# Patient Record
Sex: Female | Born: 1970 | Race: White | Hispanic: No | Smoking: Current every day smoker
Health system: Southern US, Community
[De-identification: ages and names within clinical notes are randomized; demographics above are authoritative.]

## PROBLEM LIST (undated history)

## (undated) HISTORY — PX: OOPHORECTOMY: SHX86

## (undated) HISTORY — PX: TUBAL LIGATION: SHX77

---

## 2007-01-31 ENCOUNTER — Emergency Department: Payer: Self-pay | Admitting: Emergency Medicine

## 2007-10-13 ENCOUNTER — Emergency Department: Payer: Self-pay | Admitting: Emergency Medicine

## 2008-01-31 ENCOUNTER — Ambulatory Visit: Payer: Self-pay | Admitting: Advanced Practice Midwife

## 2008-02-09 ENCOUNTER — Ambulatory Visit: Payer: Self-pay | Admitting: Advanced Practice Midwife

## 2008-03-10 ENCOUNTER — Ambulatory Visit: Payer: Self-pay | Admitting: Advanced Practice Midwife

## 2008-04-10 ENCOUNTER — Ambulatory Visit: Payer: Self-pay | Admitting: Advanced Practice Midwife

## 2008-07-04 ENCOUNTER — Inpatient Hospital Stay: Payer: Self-pay

## 2011-07-07 ENCOUNTER — Emergency Department: Payer: Self-pay | Admitting: Emergency Medicine

## 2013-05-14 ENCOUNTER — Emergency Department: Payer: Self-pay | Admitting: Emergency Medicine

## 2014-01-31 IMAGING — CR RIGHT LITTLE FINGER 2+V
1 series · 3 of 3 positions shown · non-contrast
Comparison: none

REASON FOR EXAM: fishhook in finger
COMMENTS:

PROCEDURE:     DXR - DXR FINGER PINKY 5TH DIGIT RT HA  - May 14, 2013  [DATE]
RESULT:     Comparison: None.

[Series 1: pa · 0.17mm/px · 3 of 3 slices shown]
[im 1/3]
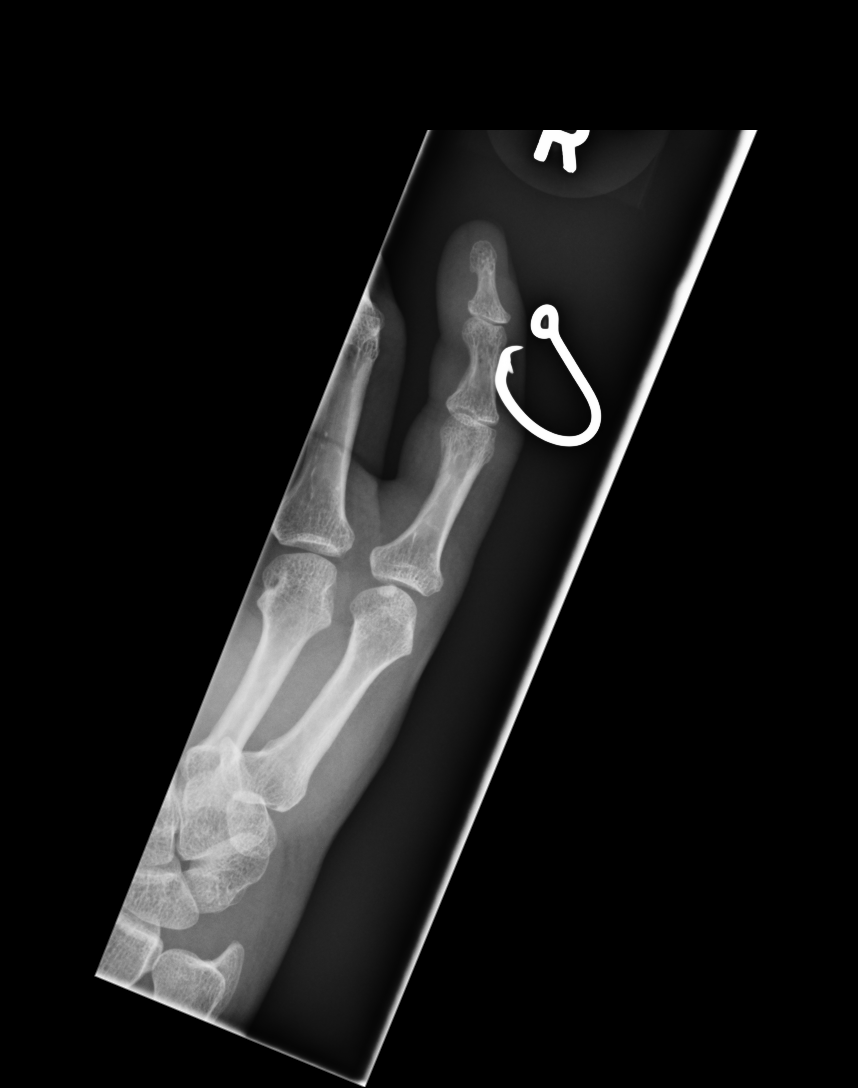
[im 2/3]
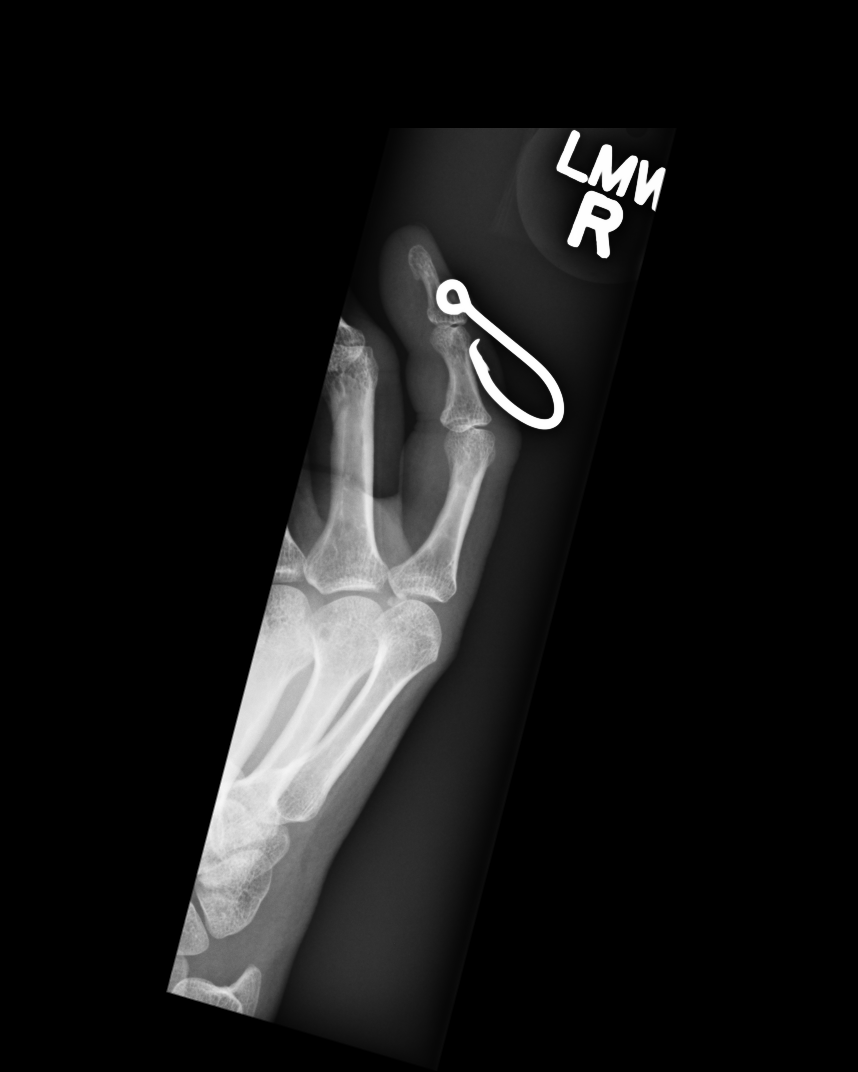
[im 3/3]
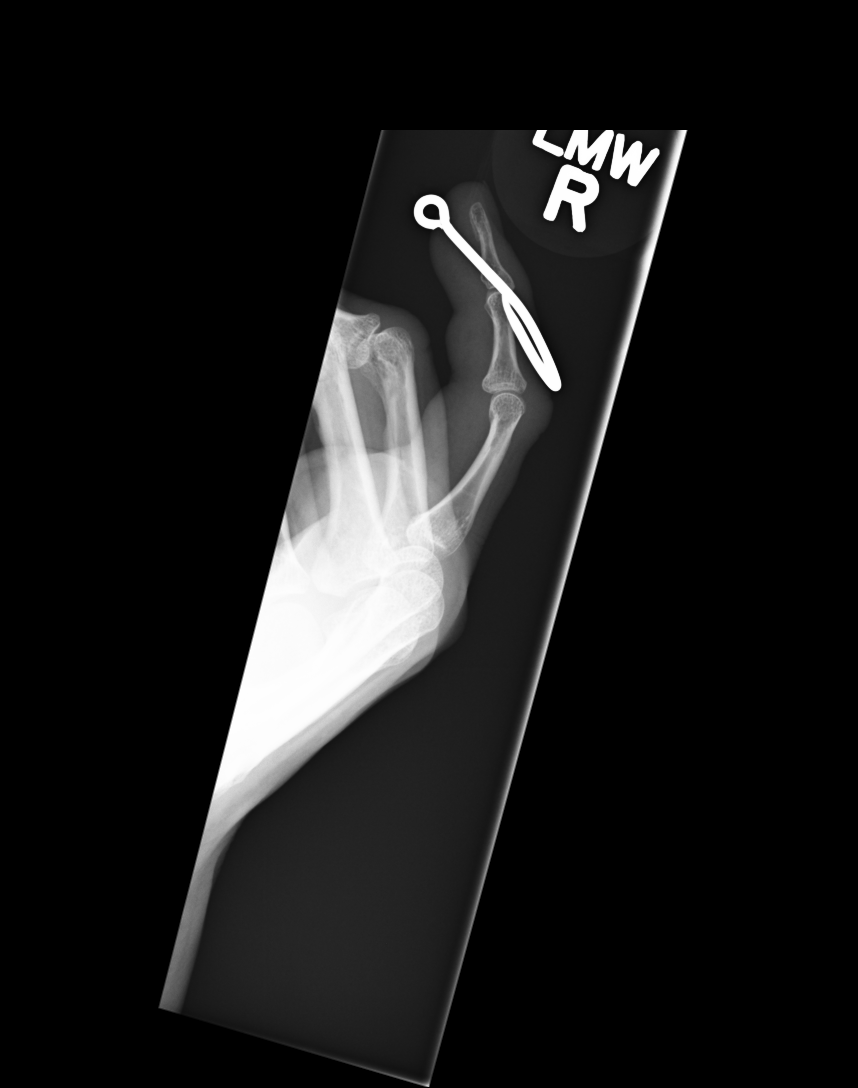

[3 of 3 positions shown; findings below may reference images not displayed]

FINDINGS: There is a metallic hook in the fifth finger. No acute fracture. Normal
alignment.
IMPRESSION: Please see above.

## 2014-09-28 ENCOUNTER — Emergency Department: Payer: Self-pay | Admitting: Emergency Medicine

## 2015-06-17 IMAGING — CR DG CLAVICLE*L*
1 series · 2 of 2 positions shown · non-contrast
Comparison: Left shoulder radiographs earlier today

CLINICAL DATA: Left shoulder pain. Possible clavicle fracture on
recent shoulder radiographs

EXAM:
LEFT CLAVICLE - 2+ VIEWS

[Series 1: dxr clavicle left · 0.14mm/px · 2 of 2 slices shown]
[im 1/2]
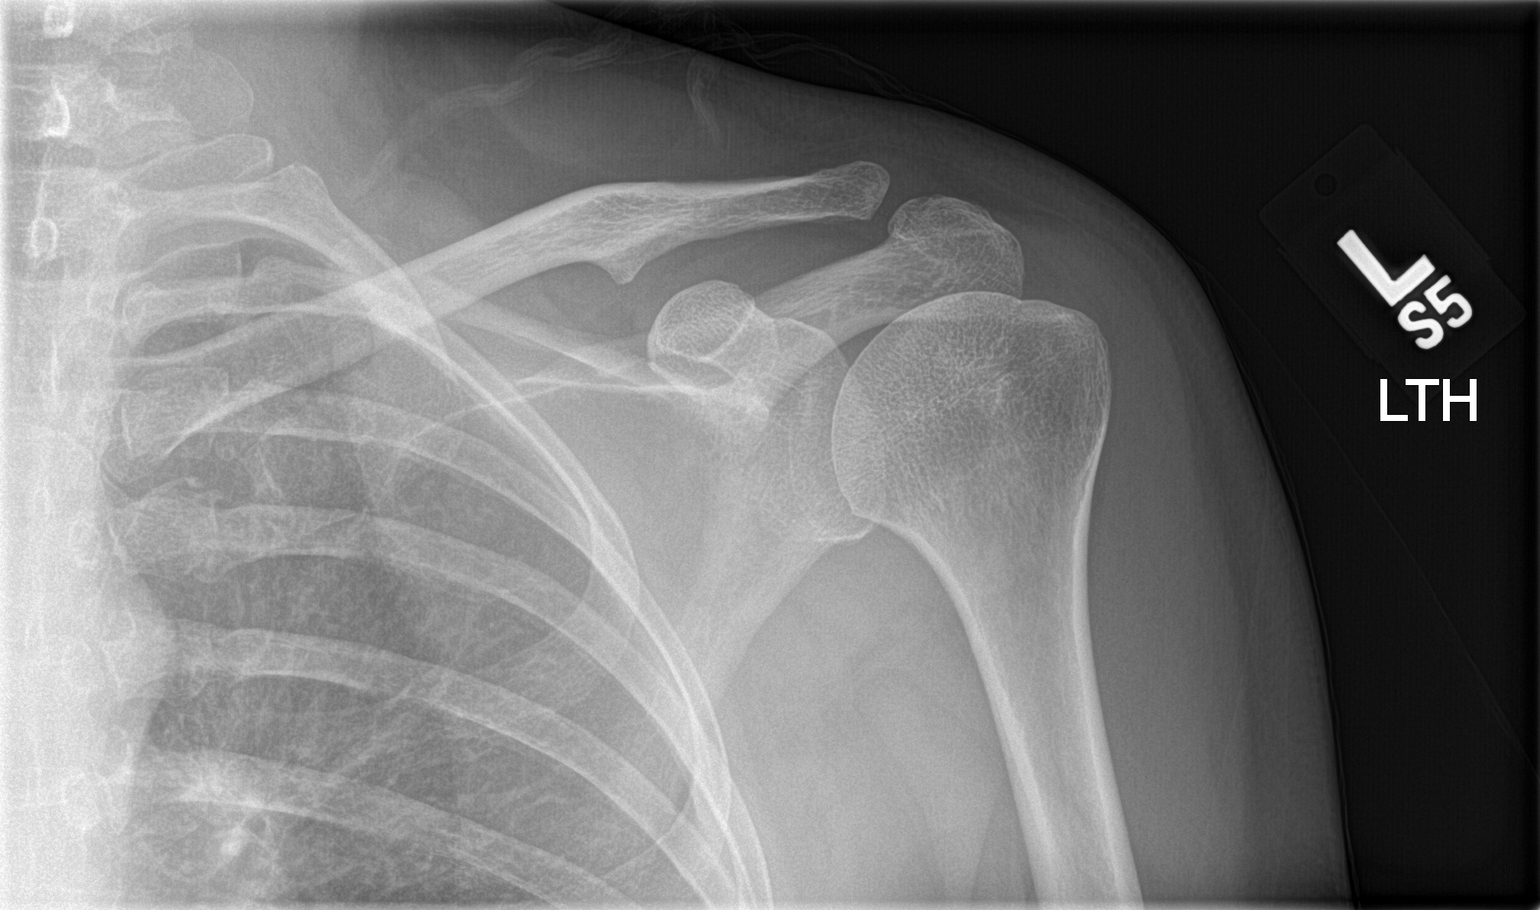
[im 2/2]
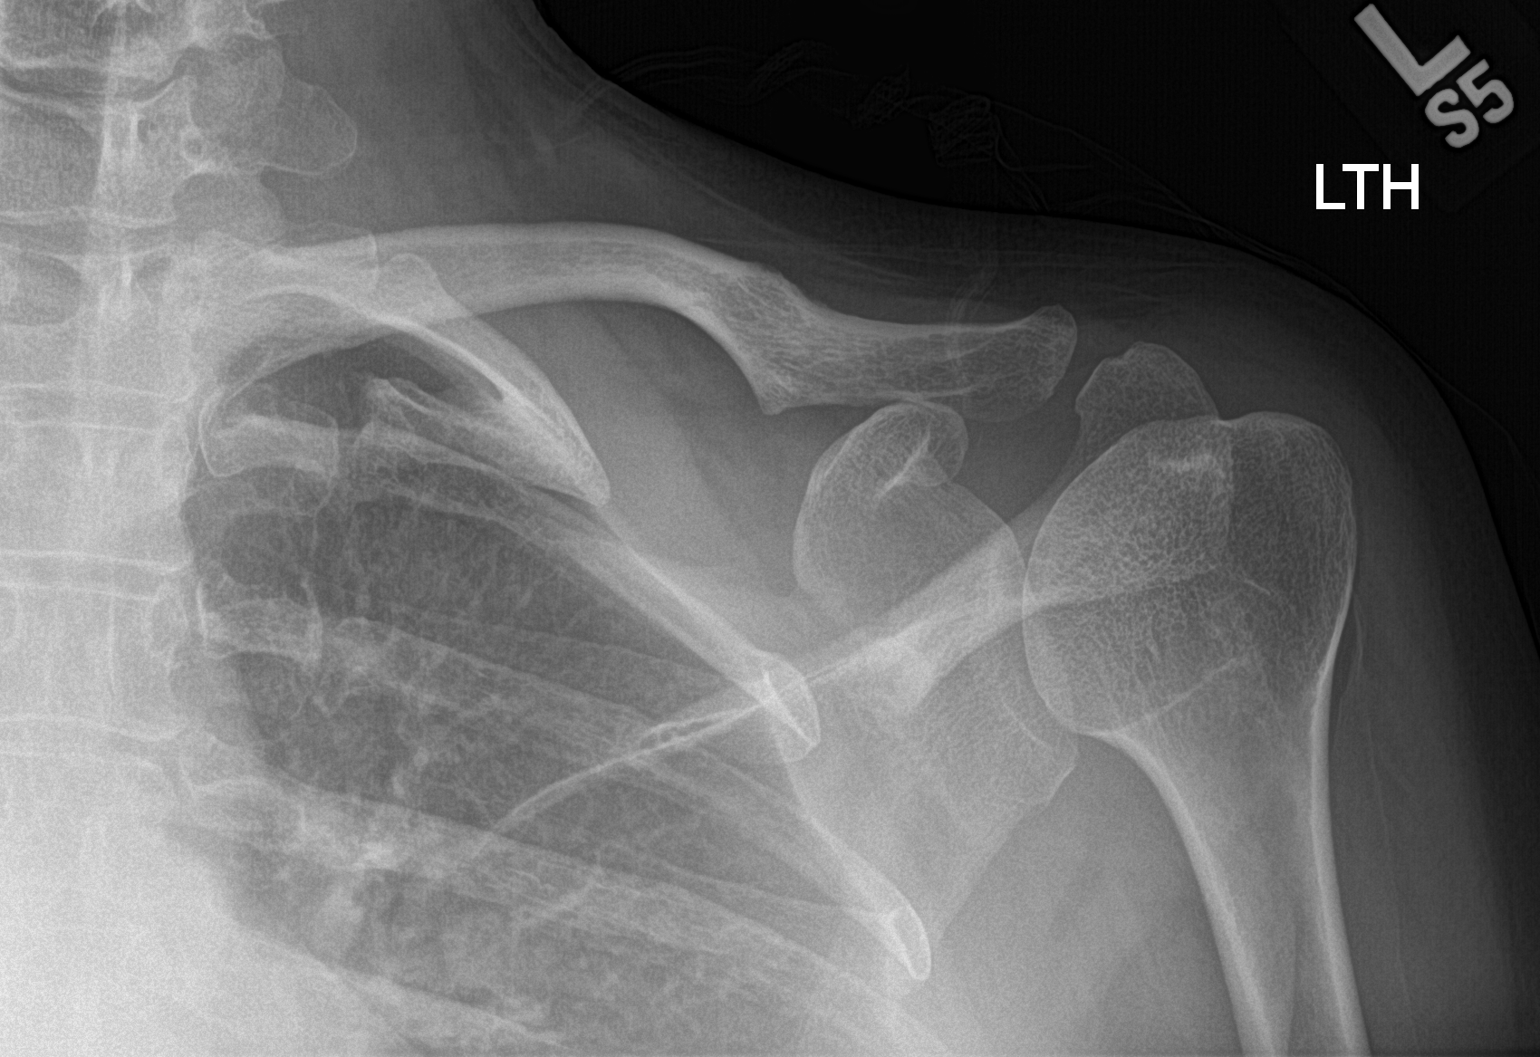

[2 of 2 positions shown; findings below may reference images not displayed]

FINDINGS: There is no evidence of fracture or other focal bone lesions. Soft
tissues are unremarkable.
IMPRESSION: Negative.

## 2017-01-27 ENCOUNTER — Ambulatory Visit (INDEPENDENT_AMBULATORY_CARE_PROVIDER_SITE_OTHER): Payer: BLUE CROSS/BLUE SHIELD | Admitting: Obstetrics and Gynecology

## 2017-01-27 ENCOUNTER — Encounter: Payer: Self-pay | Admitting: Obstetrics and Gynecology

## 2017-01-27 VITALS — BP 133/81 | HR 103 | Ht 61.0 in | Wt 144.2 lb

## 2017-01-27 DIAGNOSIS — Z0000 Encounter for general adult medical examination without abnormal findings: Secondary | ICD-10-CM | POA: Diagnosis not present

## 2017-01-27 DIAGNOSIS — Z87410 Personal history of cervical dysplasia: Secondary | ICD-10-CM | POA: Diagnosis not present

## 2017-01-27 NOTE — Progress Notes (Addendum)
HPI:      [name] is a 46 y.o. [phone] who LMP was Patient's last menstrual period was 01/06/2017 (approximate).  Subjective:   She presents today for her annual examination.  She has not had a preventative medical visit in 9 years. She is not using birth control and is having regular menstrual periods. She is a very poor historian. She is not sure what she is doing for birth control but she believes that "everything was removed at the time of her cesarean delivery"and she has not become pregnant in 9 years of unprotected intercourse. She states that at the time of her cesarean delivery one of her ovaries was removed for many cysts. She when questioned states that she had severe cervical dysplasia and some type of treatment but she does not know what. She has never had a mammogram. She does almost nothing about her family medical history.    Hx: The following portions of the patient's history were reviewed and updated as appropriate:              She  has no past medical history on file. She  does not have a problem list on file. She  has a past surgical history that includes Cesarean section; Oophorectomy; and Tubal ligation. Her family history includes Diabetes in her sister. She  reports that she has been smoking.  She has never used smokeless tobacco. She reports that she drinks alcohol. She reports that she does not use drugs. No current outpatient prescriptions on file prior to visit.   No current facility-administered medications on file prior to visit.          Review of Systems:  Review of Systems  Constitutional: Denied constitutional symptoms, night sweats, recent illness, fatigue, fever, insomnia and weight loss.  Eyes: Denied eye symptoms, eye pain, photophobia, vision change and visual disturbance.  Ears/Nose/Throat/Neck: Denied ear, nose, throat or neck symptoms, hearing loss, nasal discharge, sinus congestion and sore throat.  Cardiovascular: Denied  cardiovascular symptoms, arrhythmia, chest pain/pressure, edema, exercise intolerance, orthopnea and palpitations.  Respiratory: Denied pulmonary symptoms, asthma, pleuritic pain, productive sputum, cough, dyspnea and wheezing.  Gastrointestinal: Denied, gastro-esophageal reflux, melena, nausea and vomiting.  Genitourinary: Denied genitourinary symptoms including symptomatic vaginal discharge, pelvic relaxation issues, and urinary complaints.  Musculoskeletal: Denied musculoskeletal symptoms, stiffness, swelling, muscle weakness and myalgia.  Dermatologic: Denied dermatology symptoms, rash and scar.  Neurologic: Denied neurology symptoms, dizziness, headache, neck pain and syncope.  Psychiatric: Denied psychiatric symptoms, anxiety and depression.  Endocrine: Denied endocrine symptoms including hot flashes and night sweats.   Meds:   No current outpatient prescriptions on file prior to visit.   No current facility-administered medications on file prior to visit.     Objective:     Vitals:   01/27/17 1456  BP: 133/81  Pulse: (!) 103              Physical examination General NAD, Conversant  HEENT Atraumatic; Op clear with mmm.  Normo-cephalic. Pupils reactive. Anicteric sclerae  Thyroid/Neck Smooth without nodularity or enlargement. Normal ROM.  Neck Supple.  Skin No rashes, lesions or ulceration. Normal palpated skin turgor. No nodularity.  Breasts: No masses or discharge.  Symmetric.  No axillary adenopathy.  Lungs: Clear to auscultation.No rales or wheezes. Normal Respiratory effort, no retractions.  Heart: NSR.  No murmurs or rubs appreciated. No periferal edema  Abdomen: Soft.  Non-tender.  No masses.  No HSM. No hernia  Extremities: Moves all appropriately.  Normal ROM for age. No lymphadenopathy.  Neuro: Oriented to PPT.  Normal mood. Normal affect.     Pelvic:   Vulva: Normal appearance.  No lesions.  Vagina: No lesions or abnormalities noted.  Support: Normal pelvic  support.  Urethra No masses tenderness or scarring.  Meatus Normal size without lesions or prolapse.  Cervix: Normal appearance.  No lesions. Obviously status post postprocedure   Anus: Normal exam.  No lesions.  Perineum: Normal exam.  No lesions.        Bimanual   Uterus: Normal size.  Non-tender.  Mobile.  AV.  Adnexae: No masses.  Non-tender to palpation.  Cul-de-sac: Negative for abnormality.      Assessment:    G3P3003 There are no active problems to display for this patient.    1. Encounter for annual physical exam   2. History of cervical dysplasia        Plan:            1.  Basic Screening Recommendations The basic screening recommendations for asymptomatic women were discussed with the patient during her visit.  The age-appropriate recommendations were discussed with her and the rational for the tests reviewed.  When I am informed by the patient that another primary care physician has previously obtained the age-appropriate tests and they are up-to-date, only outstanding tests are ordered and referrals given as necessary.  Abnormal results of tests will be discussed with her when all of her results are completed. Pap performed-mammogram ordered-blood work ordered. 2.  I have asked her to sign a release form so that we may obtain her surgical records from her last cesarean delivery. I would also like the path report from her oophorectomy. Orders Orders Placed This Encounter  Procedures  . MM DIGITAL SCREENING BILATERAL  . Cholesterol, total  . Glucose, random  . Lipid panel  . TSH       F/U  Return in about 1 year (around 01/27/2018) for Annual Physical.  Elonda Husky, M.D. 01/27/2017 3:34 PM

## 2017-01-28 ENCOUNTER — Other Ambulatory Visit: Payer: BLUE CROSS/BLUE SHIELD

## 2017-01-29 ENCOUNTER — Telehealth: Payer: Self-pay

## 2017-01-29 LAB — PAP IG AND HPV HIGH-RISK
HPV, high-risk: NEGATIVE
PAP Smear Comment: 0

## 2017-01-29 LAB — LIPID PANEL
CHOL/HDL RATIO: 6.2 ratio — AB (ref 0.0–4.4)
Cholesterol, Total: 231 mg/dL — ABNORMAL HIGH (ref 100–199)
HDL: 37 mg/dL — ABNORMAL LOW (ref >39)
TRIGLYCERIDES: 533 mg/dL — AB (ref 0–149)

## 2017-01-29 LAB — TSH: TSH: 0.86 u[IU]/mL (ref 0.450–4.500)

## 2017-01-29 LAB — GLUCOSE, RANDOM: Glucose: 112 mg/dL — ABNORMAL HIGH (ref 65–99)

## 2017-01-29 NOTE — Telephone Encounter (Signed)
Message left on patients voice mail regarding abnormal labs. Provider would like patient to followup with her PCP to manage elevated glucose and cholesterol and triglycerides.

## 2017-01-29 NOTE — Telephone Encounter (Signed)
-----   Message from David James Evans, MD sent at 01/29/2017  2:02 PM EDT ----- Needs referral to Family doctor regarding elevated cholesterol and possible diabetes. 

## 2017-02-02 ENCOUNTER — Telehealth: Payer: Self-pay

## 2017-02-02 NOTE — Telephone Encounter (Signed)
-----   Message from David James Evans, MD sent at 02/02/2017  8:36 AM EDT ----- Negative Pap and HPV 

## 2017-02-02 NOTE — Telephone Encounter (Signed)
-----   Message from Linzie Collinavid James Evans, MD sent at 01/29/2017  2:02 PM EDT ----- [facility]

## 2017-02-02 NOTE — Telephone Encounter (Signed)
Message left for pt to please return my call. 

## 2017-02-02 NOTE — Telephone Encounter (Signed)
See message dated 02/02/17

## 2017-02-02 NOTE — Telephone Encounter (Signed)
Pt informed. States she does not have a PCP but will speak with the nurse at work. States she has an appointment scheduled with a Dr. But can't get in until April.

## 2017-02-02 NOTE — Telephone Encounter (Signed)
-----   Message from Linzie Collinavid James Evans, MD sent at 02/02/2017  8:36 AM EDT ----- Negative Pap and HPV

## 2018-02-02 ENCOUNTER — Encounter: Payer: Self-pay | Admitting: Obstetrics and Gynecology

## 2018-02-02 ENCOUNTER — Ambulatory Visit (INDEPENDENT_AMBULATORY_CARE_PROVIDER_SITE_OTHER): Payer: BLUE CROSS/BLUE SHIELD | Admitting: Obstetrics and Gynecology

## 2018-02-02 VITALS — BP 145/87 | HR 105 | Ht 61.0 in | Wt 136.1 lb

## 2018-02-02 DIAGNOSIS — Z1239 Encounter for other screening for malignant neoplasm of breast: Secondary | ICD-10-CM

## 2018-02-02 DIAGNOSIS — Z01419 Encounter for gynecological examination (general) (routine) without abnormal findings: Secondary | ICD-10-CM | POA: Diagnosis not present

## 2018-02-02 DIAGNOSIS — R7309 Other abnormal glucose: Secondary | ICD-10-CM

## 2018-02-02 DIAGNOSIS — Z1231 Encounter for screening mammogram for malignant neoplasm of breast: Secondary | ICD-10-CM | POA: Diagnosis not present

## 2018-02-02 DIAGNOSIS — Z7689 Persons encountering health services in other specified circumstances: Secondary | ICD-10-CM | POA: Diagnosis not present

## 2018-02-02 DIAGNOSIS — E785 Hyperlipidemia, unspecified: Secondary | ICD-10-CM

## 2018-02-02 NOTE — Progress Notes (Signed)
HPI:      [name] is a 47 y.o. [phone] who LMP was Patient's last menstrual period was 02/01/2018 (exact date).  Subjective:   She presents today for her annual examination.  She has no specific complaints. She did not go to her mammogram as scheduled last year. She was informed regarding her elevated glucose and cholesterol and she chose not to follow-up for this either. She continues to smoke tobacco but because her sister has recently begun to have problems as a result of smoking, [name] "is considering"discontinuation.    Hx: The following portions of the patient's history were reviewed and updated as appropriate:             She  has no past medical history on file. She does not have a problem list on file. She  has a past surgical history that includes Cesarean section; Tubal ligation; and Oophorectomy. Her family history includes Diabetes in her sister. She  reports that she has been smoking cigarettes.  She has been smoking about 1.00 pack per day. She has never used smokeless tobacco. She reports that she drinks alcohol. She reports that she does not use drugs. She has a current medication list which includes the following prescription(s): acetaminophen. She has No Known Allergies.       Review of Systems:  Review of Systems  Constitutional: Denied constitutional symptoms, night sweats, recent illness, fatigue, fever, insomnia and weight loss.  Eyes: Denied eye symptoms, eye pain, photophobia, vision change and visual disturbance.  Ears/Nose/Throat/Neck: Denied ear, nose, throat or neck symptoms, hearing loss, nasal discharge, sinus congestion and sore throat.  Cardiovascular: Denied cardiovascular symptoms, arrhythmia, chest pain/pressure, edema, exercise intolerance, orthopnea and palpitations.  Respiratory: Denied pulmonary symptoms, asthma, pleuritic pain, productive sputum, cough, dyspnea and wheezing.  Gastrointestinal: Denied, gastro-esophageal reflux, melena,  nausea and vomiting.  Genitourinary: Denied genitourinary symptoms including symptomatic vaginal discharge, pelvic relaxation issues, and urinary complaints.  Musculoskeletal: Denied musculoskeletal symptoms, stiffness, swelling, muscle weakness and myalgia.  Dermatologic: Denied dermatology symptoms, rash and scar.  Neurologic: Denied neurology symptoms, dizziness, headache, neck pain and syncope.  Psychiatric: Denied psychiatric symptoms, anxiety and depression.  Endocrine: Denied endocrine symptoms including hot flashes and night sweats.   Meds:   Current Outpatient Medications on File Prior to Visit  Medication Sig Dispense Refill  . acetaminophen (TYLENOL) 500 MG tablet Take 500 mg by mouth every 6 (six) hours as needed.     No current facility-administered medications on file prior to visit.     Objective:     Vitals:   02/02/18 1517  BP: (!) 145/87  Pulse: (!) 105              Physical examination General NAD, Conversant  HEENT Atraumatic; Op clear with mmm.  Normo-cephalic. Pupils reactive. Anicteric sclerae  Thyroid/Neck Smooth without nodularity or enlargement. Normal ROM.  Neck Supple.  Skin No rashes, lesions or ulceration. Normal palpated skin turgor. No nodularity.  Breasts: No masses or discharge.  Symmetric.  No axillary adenopathy.  Lungs: Clear to auscultation.No rales or wheezes. Normal Respiratory effort, no retractions.  Heart: NSR.  No murmurs or rubs appreciated. No periferal edema  Abdomen: Soft.  Non-tender.  No masses.  No HSM. No hernia  Extremities: Moves all appropriately.  Normal ROM for age. No lymphadenopathy.  Neuro: Oriented to PPT.  Normal mood. Normal affect.     Pelvic: Patient has declined pelvic examination because she is having her menstrual period  Assessment:    G3P3003 There are no active problems to display for this patient.    1. Well woman exam with routine gynecological exam   2. Screening for breast cancer   3.  Elevated lipids   4. Elevated glucose   5. Encounter to establish care     Normal Pap and HPV last year.   Plan:            1.  Basic Screening Recommendations The basic screening recommendations for asymptomatic women were discussed with the patient during her visit.  The age-appropriate recommendations were discussed with her and the rational for the tests reviewed.  When I am informed by the patient that another primary care physician has previously obtained the age-appropriate tests and they are up-to-date, only outstanding tests are ordered and referrals given as necessary.  Abnormal results of tests will be discussed with her when all of her results are completed. Cholesterol lipids and fasting glucose ordered Mammogram ordered  2.  Patient strongly encouraged to get her mammogram this year  3.  Patient strongly encouraged to follow-up with a family physician regarding her   cholesterol lipids and glucose.   4.  Patient encouraged to discontinue tobacco use.  orders Orders Placed This Encounter  Procedures  . MM DIGITAL SCREENING BILATERAL  . Glucose, random  . Hemoglobin A1c  . Lipid panel  . TSH  . [facility]    No orders of the defined types were placed in this encounter.       F/U  Return in about 1 year (around 02/03/2019) for Annual Physical.  Elonda Huskyavid J. Evans, M.D. 02/02/2018 4:02 PM

## 2018-02-03 LAB — HEMOGLOBIN A1C
ESTIMATED AVERAGE GLUCOSE: 183 mg/dL
HEMOGLOBIN A1C: 8 % — AB (ref 4.8–5.6)

## 2018-02-03 LAB — LIPID PANEL
CHOL/HDL RATIO: 4.6 ratio — AB (ref 0.0–4.4)
CHOLESTEROL TOTAL: 171 mg/dL (ref 100–199)
HDL: 37 mg/dL — ABNORMAL LOW (ref >39)
LDL Calculated: 78 mg/dL (ref 0–99)
TRIGLYCERIDES: 278 mg/dL — AB (ref 0–149)
VLDL Cholesterol Cal: 56 mg/dL — ABNORMAL HIGH (ref 5–40)

## 2018-02-03 LAB — GLUCOSE, RANDOM: GLUCOSE: 120 mg/dL — AB (ref 65–99)

## 2018-02-03 LAB — TSH: TSH: 1.01 u[IU]/mL (ref 0.450–4.500)

## 2018-06-01 ENCOUNTER — Ambulatory Visit: Payer: BLUE CROSS/BLUE SHIELD | Admitting: Family Medicine

## 2019-02-04 ENCOUNTER — Encounter: Payer: BLUE CROSS/BLUE SHIELD | Admitting: Obstetrics and Gynecology

## 2019-02-08 ENCOUNTER — Encounter: Payer: BLUE CROSS/BLUE SHIELD | Admitting: Obstetrics and Gynecology

## 2019-11-30 ENCOUNTER — Encounter: Payer: BLUE CROSS/BLUE SHIELD | Admitting: Obstetrics and Gynecology

## 2019-12-06 ENCOUNTER — Encounter: Payer: BLUE CROSS/BLUE SHIELD | Admitting: Obstetrics and Gynecology

## 2020-08-22 ENCOUNTER — Ambulatory Visit (INDEPENDENT_AMBULATORY_CARE_PROVIDER_SITE_OTHER): Payer: BC Managed Care – PPO | Admitting: Obstetrics and Gynecology

## 2020-08-22 ENCOUNTER — Other Ambulatory Visit: Payer: Self-pay

## 2020-08-22 ENCOUNTER — Other Ambulatory Visit (HOSPITAL_COMMUNITY)
Admission: RE | Admit: 2020-08-22 | Discharge: 2020-08-22 | Disposition: A | Discharge status: Home / Self Care | Payer: BC Managed Care – PPO | Source: Ambulatory Visit | Attending: Obstetrics and Gynecology | Admitting: Obstetrics and Gynecology

## 2020-08-22 ENCOUNTER — Encounter: Payer: Self-pay | Admitting: Obstetrics and Gynecology

## 2020-08-22 VITALS — BP 168/91 | HR 99 | Ht 61.0 in | Wt 147.6 lb

## 2020-08-22 DIAGNOSIS — Z1231 Encounter for screening mammogram for malignant neoplasm of breast: Secondary | ICD-10-CM

## 2020-08-22 DIAGNOSIS — Z01419 Encounter for gynecological examination (general) (routine) without abnormal findings: Secondary | ICD-10-CM

## 2020-08-22 DIAGNOSIS — E1165 Type 2 diabetes mellitus with hyperglycemia: Secondary | ICD-10-CM

## 2020-08-22 DIAGNOSIS — Z720 Tobacco use: Secondary | ICD-10-CM | POA: Diagnosis not present

## 2020-08-22 DIAGNOSIS — E785 Hyperlipidemia, unspecified: Secondary | ICD-10-CM

## 2020-08-22 DIAGNOSIS — Z124 Encounter for screening for malignant neoplasm of cervix: Secondary | ICD-10-CM | POA: Insufficient documentation

## 2020-08-22 NOTE — Progress Notes (Signed)
HPI:      Ms. WILDER AMODEI is a 49 y.o. [phone] who LMP was Patient's last menstrual period was 07/25/2020 (approximate).  Subjective:   She presents today for her annual examination.  She did not follow-up for her hyperlipidemia or elevated glucose last year.  She has not gone for her mammograms as referred. Patient continues to smoke cigarettes daily. She has not gotten her Covid vaccine because " I am afraid of that"    Hx: The following portions of the patient's history were reviewed and updated as appropriate:             She  has no past medical history on file. She does not have a problem list on file. She  has a past surgical history that includes Cesarean section; Tubal ligation; and Oophorectomy. Her family history includes Diabetes in her sister. She  reports that she has been smoking cigarettes. She has been smoking about 1.00 pack per day. She has never used smokeless tobacco. She reports current alcohol use. She reports that she does not use drugs. She has a current medication list which includes the following prescription(s): acetaminophen. She has No Known Allergies.       Review of Systems:  Review of Systems  Constitutional: Denied constitutional symptoms, night sweats, recent illness, fatigue, fever, insomnia and weight loss.  Eyes: Denied eye symptoms, eye pain, photophobia, vision change and visual disturbance.  Ears/Nose/Throat/Neck: Denied ear, nose, throat or neck symptoms, hearing loss, nasal discharge, sinus congestion and sore throat.  Cardiovascular: Denied cardiovascular symptoms, arrhythmia, chest pain/pressure, edema, exercise intolerance, orthopnea and palpitations.  Respiratory: Denied pulmonary symptoms, asthma, pleuritic pain, productive sputum, cough, dyspnea and wheezing.  Gastrointestinal: Denied, gastro-esophageal reflux, melena, nausea and vomiting.  Genitourinary: Denied genitourinary symptoms including symptomatic vaginal discharge, pelvic  relaxation issues, and urinary complaints.  Musculoskeletal: Denied musculoskeletal symptoms, stiffness, swelling, muscle weakness and myalgia.  Dermatologic: Denied dermatology symptoms, rash and scar.  Neurologic: Denied neurology symptoms, dizziness, headache, neck pain and syncope.  Psychiatric: Denied psychiatric symptoms, anxiety and depression.  Endocrine: Denied endocrine symptoms including hot flashes and night sweats.   Meds:   Current Outpatient Medications on File Prior to Visit  Medication Sig Dispense Refill  . acetaminophen (TYLENOL) 500 MG tablet Take 500 mg by mouth every 6 (six) hours as needed.     No current facility-administered medications on file prior to visit.          Objective:     Vitals:   08/22/20 1428  BP: (!) 168/91  Pulse: 99    Filed Weights   08/22/20 1428  Weight: 147 lb 9.6 oz (67 kg)              Physical examination General NAD, Conversant  HEENT Atraumatic; Op clear with mmm.  Normo-cephalic. Pupils reactive. Anicteric sclerae  Thyroid/Neck Smooth without nodularity or enlargement. Normal ROM.  Neck Supple.  Skin No rashes, lesions or ulceration. Normal palpated skin turgor. No nodularity.  Breasts: No masses or discharge.  Symmetric.  No axillary adenopathy.  Lungs: Clear to auscultation.No rales or wheezes. Normal Respiratory effort, no retractions.  Heart: NSR.  No murmurs or rubs appreciated. No periferal edema  Abdomen: Soft.  Non-tender.  No masses.  No HSM. No hernia  Extremities: Moves all appropriately.  Normal ROM for age. No lymphadenopathy.  Neuro: Oriented to PPT.  Normal mood. Normal affect.     Pelvic:   Vulva: Normal appearance.  No lesions.  Vagina: No  lesions or abnormalities noted.  Support:  Second-degree uterine prolapse  Urethra No masses tenderness or scarring.  Meatus Normal size without lesions or prolapse.  Cervix: Normal appearance.  No lesions.  Anus: Normal exam.  No lesions.  Perineum: Normal  exam.  No lesions.        Bimanual   Uterus: Normal size.  Non-tender.  Mobile.  AV.  Adnexae: No masses.  Non-tender to palpation.  Cul-de-sac: Negative for abnormality.      Assessment:    G3P3003 There are no problems to display for this patient.    1. Well woman exam with routine gynecological exam   2. Hyperlipidemia, unspecified hyperlipidemia type   3. Uncontrolled type 2 diabetes mellitus with hyperglycemia (HCC)   4. Tobacco abuse     Patient reluctant to follow-up for abnormal lab results or to present for basic screening recommendations.   Plan:            1.  Basic Screening Recommendations The basic screening recommendations for asymptomatic women were discussed with the patient during her visit.  The age-appropriate recommendations were discussed with her and the rational for the tests reviewed.  When I am informed by the patient that another primary care physician has previously obtained the age-appropriate tests and they are up-to-date, only outstanding tests are ordered and referrals given as necessary.  Abnormal results of tests will be discussed with her when all of her results are completed.  Routine preventative health maintenance measures emphasized: Exercise/Diet/Weight control, Tobacco Warnings, Alcohol/Substance use risks and Stress Management Pap performed -mammogram ordered -lab work ordered 2.  Referral name number location given for [facility] 3.  Advised patient to discontinue tobacco use 4.  Advised patient on Covid vaccination. Orders No orders of the defined types were placed in this encounter.   No orders of the defined types were placed in this encounter.           F/U  No follow-ups on file.  Elonda Husky, M.D. 08/22/2020 2:47 PM

## 2020-08-23 LAB — LIPID PANEL
Chol/HDL Ratio: 6.6 ratio — ABNORMAL HIGH (ref 0.0–4.4)
Cholesterol, Total: 218 mg/dL — ABNORMAL HIGH (ref 100–199)
HDL: 33 mg/dL — ABNORMAL LOW (ref >39)
LDL Chol Calc (NIH): 82 mg/dL (ref 0–99)
Triglycerides: 640 mg/dL (ref 0–149)
VLDL Cholesterol Cal: 103 mg/dL — ABNORMAL HIGH (ref 5–40)

## 2020-08-23 LAB — HEMOGLOBIN A1C
Est. average glucose Bld gHb Est-mCnc: 217 mg/dL
Hgb A1c MFr Bld: 9.2 % — ABNORMAL HIGH (ref 4.8–5.6)

## 2020-08-23 LAB — TSH: TSH: 0.919 u[IU]/mL (ref 0.450–4.500)

## 2020-08-24 LAB — CYTOLOGY - PAP
Comment: NEGATIVE
Diagnosis: NEGATIVE
High risk HPV: NEGATIVE

## 2020-08-28 ENCOUNTER — Telehealth: Payer: Self-pay

## 2020-08-28 NOTE — Telephone Encounter (Signed)
Voicemail message left for patient- stressed the importance of a followup appointment with her PCP regarding very elevated triglycerides.
